# Patient Record
Sex: Male | Born: 1975 | Hispanic: Yes | Marital: Married | Smoking: Never smoker
Health system: Southern US, Community
[De-identification: ages and names within clinical notes are randomized; demographics above are authoritative.]

## PROBLEM LIST (undated history)

## (undated) DIAGNOSIS — K219 Gastro-esophageal reflux disease without esophagitis: Secondary | ICD-10-CM

## (undated) DIAGNOSIS — Z981 Arthrodesis status: Secondary | ICD-10-CM

## (undated) HISTORY — DX: Arthrodesis status: Z98.1

## (undated) HISTORY — PX: TONSILLECTOMY: SUR1361

## (undated) HISTORY — PX: SPINAL FUSION: SHX223

## (undated) HISTORY — PX: BACK SURGERY: SHX140

---

## 2020-05-28 ENCOUNTER — Emergency Department (HOSPITAL_COMMUNITY): Payer: Medicare Other

## 2020-05-28 ENCOUNTER — Other Ambulatory Visit: Payer: Self-pay

## 2020-05-28 ENCOUNTER — Encounter (HOSPITAL_COMMUNITY): Payer: Self-pay | Admitting: Emergency Medicine

## 2020-05-28 ENCOUNTER — Emergency Department (HOSPITAL_COMMUNITY)
Admission: EM | Admit: 2020-05-28 | Discharge: 2020-05-28 | Disposition: A | Discharge status: Home / Self Care | Payer: Medicare Other | Attending: Emergency Medicine | Admitting: Emergency Medicine

## 2020-05-28 DIAGNOSIS — S39012A Strain of muscle, fascia and tendon of lower back, initial encounter: Secondary | ICD-10-CM

## 2020-05-28 DIAGNOSIS — Y9389 Activity, other specified: Secondary | ICD-10-CM | POA: Insufficient documentation

## 2020-05-28 DIAGNOSIS — R519 Headache, unspecified: Secondary | ICD-10-CM | POA: Insufficient documentation

## 2020-05-28 DIAGNOSIS — Y92410 Unspecified street and highway as the place of occurrence of the external cause: Secondary | ICD-10-CM | POA: Insufficient documentation

## 2020-05-28 DIAGNOSIS — S161XXA Strain of muscle, fascia and tendon at neck level, initial encounter: Secondary | ICD-10-CM

## 2020-05-28 DIAGNOSIS — Y999 Unspecified external cause status: Secondary | ICD-10-CM | POA: Insufficient documentation

## 2020-05-28 DIAGNOSIS — S199XXA Unspecified injury of neck, initial encounter: Secondary | ICD-10-CM | POA: Diagnosis present
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
## 2020-05-28 MED ORDER — DIAZEPAM 5 MG PO TABS
5.0000 mg | ORAL_TABLET | Freq: Two times a day (BID) | ORAL | 0 refills | Status: DC
Start: 2020-05-28 — End: 2020-10-02

## 2020-05-28 MED ORDER — OXYCODONE-ACETAMINOPHEN 5-325 MG PO TABS: 1.0000 | ORAL_TABLET | ORAL | 0 refills | Status: DC | PRN

## 2020-05-28 MED ORDER — MORPHINE SULFATE (PF) 4 MG/ML IV SOLN
4.0000 mg | Freq: Once | INTRAVENOUS | Status: AC
  Administered 2020-05-28: 4 mg via INTRAVENOUS
  Filled 2020-05-28: qty 1

## 2020-05-28 MED ORDER — LORAZEPAM 2 MG/ML IJ SOLN
1.0000 mg | Freq: Once | INTRAMUSCULAR | Status: AC
  Administered 2020-05-28: 1 mg via INTRAVENOUS
  Filled 2020-05-28: qty 1

## 2020-05-28 MED ORDER — ONDANSETRON HCL 4 MG/2ML IJ SOLN
4.0000 mg | Freq: Once | INTRAMUSCULAR | Status: AC
  Administered 2020-05-28: 4 mg via INTRAVENOUS
  Filled 2020-05-28: qty 2
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
## 2020-05-28 NOTE — ED Notes (Signed)
Patient verbalizes understanding of discharge instructions. Opportunity for questioning and answers were provided. Armband removed by staff, pt discharged from ED ambulatory to home.  

## 2020-10-02 ENCOUNTER — Encounter: Payer: Self-pay | Admitting: Emergency Medicine

## 2020-10-02 ENCOUNTER — Emergency Department: Payer: No Typology Code available for payment source

## 2020-10-02 ENCOUNTER — Other Ambulatory Visit: Payer: Self-pay

## 2020-10-02 ENCOUNTER — Ambulatory Visit (INDEPENDENT_AMBULATORY_CARE_PROVIDER_SITE_OTHER)
Admission: EM | Admit: 2020-10-02 | Discharge: 2020-10-02 | Disposition: A | Discharge status: Home / Self Care | Payer: No Typology Code available for payment source | Source: Home / Self Care

## 2020-10-02 ENCOUNTER — Observation Stay
Admission: EM | Admit: 2020-10-02 | Discharge: 2020-10-03 | Disposition: A | Discharge status: Home / Self Care | Payer: No Typology Code available for payment source | Attending: Internal Medicine | Admitting: Internal Medicine

## 2020-10-02 DIAGNOSIS — R200 Anesthesia of skin: Secondary | ICD-10-CM | POA: Diagnosis not present

## 2020-10-02 DIAGNOSIS — R0789 Other chest pain: Principal | ICD-10-CM | POA: Diagnosis present

## 2020-10-02 DIAGNOSIS — I10 Essential (primary) hypertension: Secondary | ICD-10-CM | POA: Insufficient documentation

## 2020-10-02 DIAGNOSIS — I493 Ventricular premature depolarization: Secondary | ICD-10-CM

## 2020-10-02 DIAGNOSIS — R030 Elevated blood-pressure reading, without diagnosis of hypertension: Secondary | ICD-10-CM | POA: Diagnosis not present

## 2020-10-02 DIAGNOSIS — R079 Chest pain, unspecified: Secondary | ICD-10-CM

## 2020-10-02 DIAGNOSIS — Z20822 Contact with and (suspected) exposure to covid-19: Secondary | ICD-10-CM | POA: Insufficient documentation

## 2020-10-02 DIAGNOSIS — R002 Palpitations: Secondary | ICD-10-CM | POA: Insufficient documentation

## 2020-10-02 HISTORY — DX: Gastro-esophageal reflux disease without esophagitis: K21.9

## 2020-10-02 LAB — URINALYSIS, ROUTINE W REFLEX MICROSCOPIC
Bilirubin Urine: NEGATIVE
Glucose, UA: NEGATIVE mg/dL
Hgb urine dipstick: NEGATIVE
Ketones, ur: NEGATIVE mg/dL
Leukocytes,Ua: NEGATIVE
Nitrite: NEGATIVE
Protein, ur: NEGATIVE mg/dL
Specific Gravity, Urine: 1.023 (ref 1.005–1.030)
pH: 6 (ref 5.0–8.0)

## 2020-10-02 LAB — BASIC METABOLIC PANEL
Anion gap: 7 (ref 5–15)
BUN: 12 mg/dL (ref 6–20)
CO2: 28 mmol/L (ref 22–32)
Calcium: 9 mg/dL (ref 8.9–10.3)
Chloride: 103 mmol/L (ref 98–111)
Creatinine, Ser: 1.08 mg/dL (ref 0.61–1.24)
GFR, Estimated: >60 mL/min (ref >60)
Glucose, Bld: 101 mg/dL — ABNORMAL HIGH (ref 70–99)
Potassium: 4.3 mmol/L (ref 3.5–5.1)
Sodium: 138 mmol/L (ref 135–145)

## 2020-10-02 LAB — HEPATIC FUNCTION PANEL
ALT: 25 U/L (ref 0–44)
AST: 27 U/L (ref 15–41)
Albumin: 3.8 g/dL (ref 3.5–5.0)
Alkaline Phosphatase: 58 U/L (ref 38–126)
Bilirubin, Direct: <0.1 mg/dL (ref 0.0–0.2)
Total Bilirubin: 0.6 mg/dL (ref 0.3–1.2)
Total Protein: 7.7 g/dL (ref 6.5–8.1)

## 2020-10-02 LAB — CBC
HCT: 43.5 % (ref 39.0–52.0)
Hemoglobin: 14.7 g/dL (ref 13.0–17.0)
MCH: 29.1 pg (ref 26.0–34.0)
MCHC: 33.8 g/dL (ref 30.0–36.0)
MCV: 86 fL (ref 80.0–100.0)
Platelets: 250 10*3/uL (ref 150–400)
RBC: 5.06 MIL/uL (ref 4.22–5.81)
RDW: 12.9 % (ref 11.5–15.5)
WBC: 8.8 10*3/uL (ref 4.0–10.5)
nRBC: 0 % (ref 0.0–0.2)

## 2020-10-02 LAB — URINE DRUG SCREEN, QUALITATIVE (ARMC ONLY)
Amphetamines, Ur Screen: NOT DETECTED
Barbiturates, Ur Screen: NOT DETECTED
Benzodiazepine, Ur Scrn: NOT DETECTED
Cannabinoid 50 Ng, Ur ~~LOC~~: NOT DETECTED
Cocaine Metabolite,Ur ~~LOC~~: NOT DETECTED
MDMA (Ecstasy)Ur Screen: NOT DETECTED
Methadone Scn, Ur: NOT DETECTED
Opiate, Ur Screen: NOT DETECTED
Phencyclidine (PCP) Ur S: NOT DETECTED
Tricyclic, Ur Screen: NOT DETECTED

## 2020-10-02 LAB — RESPIRATORY PANEL BY RT PCR (FLU A&B, COVID)
Influenza A by PCR: NEGATIVE
Influenza B by PCR: NEGATIVE
SARS Coronavirus 2 by RT PCR: NEGATIVE

## 2020-10-02 LAB — MAGNESIUM: Magnesium: 2 mg/dL (ref 1.7–2.4)

## 2020-10-02 LAB — TSH: TSH: 1.265 u[IU]/mL (ref 0.350–4.500)

## 2020-10-02 LAB — PHOSPHORUS: Phosphorus: 3 mg/dL (ref 2.5–4.6)

## 2020-10-02 LAB — FIBRIN DERIVATIVES D-DIMER (ARMC ONLY): Fibrin derivatives D-dimer (ARMC): 1048.57 ng/mL (FEU) — ABNORMAL HIGH (ref 0.00–499.00)

## 2020-10-02 LAB — TROPONIN I (HIGH SENSITIVITY)
Troponin I (High Sensitivity): 4 ng/L (ref <18)
Troponin I (High Sensitivity): 5 ng/L (ref <18)

## 2020-10-02 MED ORDER — HYDROCODONE-ACETAMINOPHEN 5-325 MG PO TABS: 1.0000 | ORAL_TABLET | ORAL | Status: DC | PRN

## 2020-10-02 MED ORDER — ACETAMINOPHEN 650 MG RE SUPP: 650.0000 mg | Freq: Four times a day (QID) | RECTAL | Status: DC | PRN

## 2020-10-02 MED ORDER — ASPIRIN 81 MG PO CHEW
CHEWABLE_TABLET | ORAL | Status: AC
  Administered 2020-10-02: 324 mg via ORAL
  Filled 2020-10-02: qty 4

## 2020-10-02 MED ORDER — ONDANSETRON HCL 4 MG PO TABS: 4.0000 mg | ORAL_TABLET | Freq: Four times a day (QID) | ORAL | Status: DC | PRN

## 2020-10-02 MED ORDER — SODIUM CHLORIDE 0.9% FLUSH
3.0000 mL | Freq: Two times a day (BID) | INTRAVENOUS | Status: DC
  Administered 2020-10-02: 3 mL via INTRAVENOUS

## 2020-10-02 MED ORDER — PANTOPRAZOLE SODIUM 40 MG PO TBEC
40.0000 mg | DELAYED_RELEASE_TABLET | Freq: Every day | ORAL | Status: DC
  Administered 2020-10-03: 40 mg via ORAL
  Filled 2020-10-02: qty 1

## 2020-10-02 MED ORDER — SODIUM CHLORIDE 0.9% FLUSH: 3.0000 mL | INTRAVENOUS | Status: DC | PRN

## 2020-10-02 MED ORDER — SODIUM CHLORIDE 0.9 % IV SOLN: 250.0000 mL | INTRAVENOUS | Status: DC | PRN

## 2020-10-02 MED ORDER — ACETAMINOPHEN 325 MG PO TABS: 650.0000 mg | ORAL_TABLET | Freq: Four times a day (QID) | ORAL | Status: DC | PRN

## 2020-10-02 MED ORDER — OXYCODONE HCL 5 MG PO TABS: 5.0000 mg | ORAL_TABLET | Freq: Every day | ORAL | Status: DC | PRN

## 2020-10-02 MED ORDER — ASPIRIN 81 MG PO CHEW
324.0000 mg | CHEWABLE_TABLET | Freq: Once | ORAL | Status: AC
  Administered 2020-10-02: 324 mg via ORAL

## 2020-10-02 MED ORDER — METOPROLOL TARTRATE 25 MG PO TABS
12.5000 mg | ORAL_TABLET | Freq: Two times a day (BID) | ORAL | Status: DC
  Administered 2020-10-02 – 2020-10-03 (×2): 12.5 mg via ORAL
  Filled 2020-10-02 (×2): qty 1

## 2020-10-02 MED ORDER — DULOXETINE HCL 30 MG PO CPEP
60.0000 mg | ORAL_CAPSULE | Freq: Every day | ORAL | Status: DC
  Administered 2020-10-03: 60 mg via ORAL
  Filled 2020-10-02: qty 2

## 2020-10-02 MED ORDER — ENOXAPARIN SODIUM 80 MG/0.8ML ~~LOC~~ SOLN
70.0000 mg | Freq: Every day | SUBCUTANEOUS | Status: DC
  Filled 2020-10-02: qty 0.8

## 2020-10-02 MED ORDER — ONDANSETRON HCL 4 MG/2ML IJ SOLN: 4.0000 mg | Freq: Four times a day (QID) | INTRAMUSCULAR | Status: DC | PRN

## 2020-10-02 MED ORDER — TIZANIDINE HCL 4 MG PO TABS
4.0000 mg | ORAL_TABLET | Freq: Four times a day (QID) | ORAL | Status: DC | PRN
  Administered 2020-10-02: 4 mg via ORAL
  Filled 2020-10-02 (×3): qty 1

## 2020-10-02 MED ORDER — ASPIRIN EC 81 MG PO TBEC
81.0000 mg | DELAYED_RELEASE_TABLET | Freq: Every day | ORAL | Status: DC
  Administered 2020-10-03: 81 mg via ORAL
  Filled 2020-10-02: qty 1

## 2020-10-02 NOTE — ED Triage Notes (Signed)
Patient c/o palpitations off and on for the past 5 days.  Patient c/o left sided neck pain and left arm numbness that started 4-4 days ago. Patient denies chest pain.

## 2020-10-02 NOTE — ED Triage Notes (Signed)
Pt reports intermittent sharp chest pain for the last 4 days. Pt reports SOB with the pain and radiation to his left arm and neck

## 2020-10-02 NOTE — H&P (Signed)
Brad Lowe WUJ:[id number] DOB: [birth year] DOA: 10/02/2020     PCP: Dellia Cloud, MD  [facility]:   NONE    Patient arrived to ER on 10/02/20 at 1340 Referred by Attending Sharyn Creamer, MD   Patient coming from: home Lives  With family    Chief Complaint:  Chief Complaint  Patient presents with  . Chest Pain  . Nausea    HPI: Brad Lowe is a 44 y.o. male with medical history significant of obesity, back pain, GERD, sp spinal fusion  Presented with   5 days of palpitations on and off, left neck pain and left arm numbness for the past 4 days, no CP. Noted elevated BP up to 161/99 Had prior cardiac cath 2018 that was normal. In the past he had similar symptoms and was told it was due to Neurontin he stopped taking it and symptoms have improved.   No Known HTN,DM or HL No new meds' CAD in family mother in late fourties Mother has Crest syndrome  Reports EtOh  Occasional heavy few time a year 1-2 beers every other week Denies excessive Caffeine intake  Infectious risk factors:  Reports shortness of breath   Has NOt been vaccinated against COVID Feb/March  Initial COVID TEST   in house  PCR testing  Pending  No results found for: SARSCOV2NAA   Regarding pertinent Chronic problems:   Morbid obesity-   BMI Readings from Last 1 Encounters:  10/02/20 42.72 kg/m    While in ER:  Patient is clinically symptomatic with every PVC Describes sensation as brief chest pressure when PVC occures  Troponin wnL ECG with bigemini and PVC's recurent chest pressure in ER  Hospitalist was called for admission for Chest pressure and abnormal ECG  The following Work up has been ordered so far:  Orders Placed This Encounter  Procedures  . DG Chest 2 View  . CBC  . Basic metabolic panel  . TSH  . Document Height and Actual Weight  . Consult to hospitalist  ALL PATIENTS BEING ADMITTED/HAVING PROCEDURES NEED COVID-19 SCREENING  . ED EKG      Following Medications were ordered in ER: Medications  aspirin chewable tablet 324 mg (324 mg Oral Given 10/02/20 1628)        Consult Orders  (From admission, onward)         Start     Ordered   10/02/20 1802  Consult to hospitalist  ALL PATIENTS BEING ADMITTED/HAVING PROCEDURES NEED COVID-19 SCREENING  Once       Comments: ALL PATIENTS BEING ADMITTED/HAVING PROCEDURES NEED COVID-19 SCREENING  Provider:  (Not yet assigned)  Question Answer Comment  Place call to: hospitalist   Reason for Consult Admit   Diagnosis/Clinical Info for Consult: chest pain and bigeminy      10/02/20 1802          Significant initial  Findings: Abnormal Labs Reviewed  BASIC METABOLIC PANEL - Abnormal; Notable for the following components:      Result Value   Glucose, Bld 101 (*)    All other components within normal limits    Otherwise labs showing:   Recent Labs  Lab 10/02/20 1415  NA 138  K 4.3  CO2 28  GLUCOSE 101*  BUN 12  CREATININE 1.08  CALCIUM 9.0    Cr  stable,   Lab Results  Component Value Date   CREATININE 1.08 10/02/2020    No results for input(s): AST, ALT, ALKPHOS,  BILITOT, PROT, ALBUMIN in the last 168 hours. Lab Results  Component Value Date   CALCIUM 9.0 10/02/2020   WBC      Component Value Date/Time   WBC 8.8 10/02/2020 1351    Plt: Lab Results  Component Value Date   PLT 250 10/02/2020    HG/HCT  stable,       Component Value Date/Time   HGB 14.7 10/02/2020 1351   HCT 43.5 10/02/2020 1351   MCV 86.0 10/02/2020 1351    Troponin 4-5     ECG: Ordered Personally reviewed by me showing: HR : 78 Rhythm:  NSR,  PVCs (bigeminy)   nonspecific changes  QTC 462  Repeat ECG showing NSR    UA   Ordered     Ordered CXR -  NON acute   ED Triage Vitals  Enc Vitals Group     BP 10/02/20 1348 (!) 146/78     Pulse Rate 10/02/20 1348 79     Resp 10/02/20 1348 16     Temp 10/02/20 1348 98.6 F (37 C)     Temp Source 10/02/20 1348 Oral      SpO2 10/02/20 1348 99 %     Weight 10/02/20 1343 (!) 315 lb (142.9 kg)     Height 10/02/20 1343 6' (1.829 m)     Head Circumference --      Peak Flow --      Pain Score --      Pain Loc --      Pain Edu? --      Excl. in GC? --   TMAX(24)@       Latest  Blood pressure 133/84, pulse 71, temperature 98.6 F (37 C), temperature source Oral, resp. rate (!) 22, height 6' (1.829 m), weight (!) 142.9 kg, SpO2 97 %.    Review of Systems:    Pertinent positives include: chest pressure  Constitutional:  No weight loss, night sweats, Fevers, chills, fatigue, weight loss  HEENT:  No headaches, Difficulty swallowing,Tooth/dental problems,Sore throat,  No sneezing, itching, ear ache, nasal congestion, post nasal drip,  Cardio-vascular:  No chest pain, Orthopnea, PND, anasarca, dizziness, palpitations.no Bilateral lower extremity swelling  GI:  No heartburn, indigestion, abdominal pain, nausea, vomiting, diarrhea, change in bowel habits, loss of appetite, melena, blood in stool, hematemesis Resp:  no shortness of breath at rest. No dyspnea on exertion, No excess mucus, no productive cough, No non-productive cough, No coughing up of blood.No change in color of mucus.No wheezing. Skin:  no rash or lesions. No jaundice GU:  no dysuria, change in color of urine, no urgency or frequency. No straining to urinate.  No flank pain.  Musculoskeletal:  No joint pain or no joint swelling. No decreased range of motion. No back pain.  Psych:  No change in mood or affect. No depression or anxiety. No memory loss.  Neuro: no localizing neurological complaints, no tingling, no weakness, no double vision, no gait abnormality, no slurred speech, no confusion  All systems reviewed and apart from HOPI all are negative  Past Medical History:   Past Medical History:  Diagnosis Date  . GERD (gastroesophageal reflux disease)   . History of spinal fusion      Past Surgical History:  Procedure  Laterality Date  . BACK SURGERY    . SPINAL FUSION    . TONSILLECTOMY      Social History:  Ambulatory   Independently     reports that he has never smoked. He  has never used smokeless tobacco. He reports current alcohol use. He reports that he does not use drugs.     Family History:   Family History  Problem Relation Age of Onset  . Hypertension Mother   . Stroke Mother   . Healthy Father     Allergies: No Known Allergies   Prior to Admission medications   Medication Sig Start Date End Date Taking? Authorizing Provider  acetaminophen (TYLENOL) 325 MG tablet Take 325-650 mg by mouth every 6 (six) hours as needed for mild pain or fever.   Yes [provider]  DULoxetine (CYMBALTA) 60 MG capsule Take 60 mg by mouth daily.   Yes [provider]  meloxicam (MOBIC) 15 MG tablet Take 7.5-15 mg by mouth daily.    Yes [provider]  omeprazole (PRILOSEC) 20 MG capsule Take 20 mg by mouth daily.   Yes [provider]  oxyCODONE (OXY IR/ROXICODONE) 5 MG immediate release tablet Take 5 mg by mouth daily as needed for moderate pain or severe pain.   Yes [provider]  tiZANidine (ZANAFLEX) 4 MG tablet Take 4 mg by mouth every 6 (six) hours as needed for muscle spasms.   Yes [provider]  Takes Cialis every day  Physical Exam: Vitals with BMI 10/02/2020 10/02/2020 10/02/2020  Height - - -  Weight - - -  BMI - - -  Systolic 133 150 [id number]  Diastolic 84 85 89  Pulse 71 72 80     1. General:  in No Acute distress   Chronically ill -appearing 2. Psychological: Alert and   Oriented 3. Head/ENT:     Dry Mucous Membranes                          Head Non traumatic, neck supple                          Normal   Dentition 4. SKIN: decreased Skin turgor,  Skin clean Dry and intact no rash 5. Heart: Regular rate and rhythm no  Murmur, no Rub or gallop 6. Lungs:   Clear to auscultation bilaterally, no wheezes or crackles   7.  Abdomen: Soft,  on-tender, Non distended   Obese bowel sounds present 8. Lower extremities: no clubbing, cyanosis, no  edema 9. Neurologically Grossly intact, moving all 4 extremities equally  10. MSK: Normal range of motion   All other LABS:     Recent Labs  Lab 10/02/20 1351  WBC 8.8  HGB 14.7  HCT 43.5  MCV 86.0  PLT 250     Recent Labs  Lab 10/02/20 1415  NA 138  K 4.3  CL 103  CO2 28  GLUCOSE 101*  BUN 12  CREATININE 1.08  CALCIUM 9.0     No results for input(s): AST, ALT, ALKPHOS, BILITOT, PROT, ALBUMIN in the last 168 hours.     Cultures: No results found for: SDES, SPECREQUEST, CULT, REPTSTATUS   Radiological Exams on Admission: DG Chest 2 View  Result Date: 10/02/2020 CLINICAL DATA:  Chest pain and shortness of breath EXAM: CHEST - 2 VIEW COMPARISON:  None. FINDINGS: The heart size and mediastinal contours are within normal limits. Both lungs are clear. The visualized skeletal structures are unremarkable. IMPRESSION: No active cardiopulmonary disease. Electronically Signed   By: Gaylyn RongWalter  Liebkemann M.D.   On: 10/02/2020 14:28    Chart has been reviewed  Assessment/Plan  44 y.o. male with medical history significant of obesity, back pain, GERD, sp spina fusion Admitted for chest pressure and abnormal ECG with frequent PVC  Present on Admission: . Left chest pressure - - H=  1 ,E= 1  ,A=0 , R   1 , T 0  ,  for the  Total of 3 therefore will admit for observation and further evaluation ( Risk of MACE: Scores 0-3  of 0.9-1.7%.,  4-6: 12-16.6% , Scores ?7: 50-65% ) - if d.dimer is sig elevated would get CTA - troponin unremarkable  -non-specific changes on ECG Frequent PVC's that are symptomatic and likely what is causing Pt discomfort  - Electrolytes WNL Discuss with cardiology - may need BB   - monitor on telemetry, cycle cardiac enzymes, obtain serial ECG and  ECHO in AM.   - Daily aspirin -  Further risk stratify with lipid panel, hgA1C,  TSH  wnl  Make sure patient is on Aspirin.  We will notify cardiology regarding patient's admission. Further management depends on pending  workup  . Obesity, Class III, BMI 40-49.9 (morbid obesity) (HCC) -would benefit from nutritional consult as an outpatient  . Elevated BP without diagnosis of hypertension -monitor blood pressure if persistent will need reassessment as an outpatient and possibly initiation of blood pressure medication May need to start betablocker prior to dc  Other plan as per orders.  DVT prophylaxis:   Lovenox      Code Status:    Code Status: Not on file FULL CODE   as per patient  I had personally discussed CODE STATUS with patient    Family Communication:   Family  at  Bedside  plan of care was discussed  with SO Disposition Plan:   To home once workup is complete and patient is stable   Following barriers for discharge:                                                        Chest pressure controlled with PO medications                                                        Will need consultants to evaluate patient prior to discharge                       Consults called:  Cardiology notified Dr. Juliann Pares  Admission status:  ED Disposition    ED Disposition Condition Comment   Admit  Hospital [facility] [[id number]]  Level of Care: Med-Surg [16]  Covid Evaluation: Asymptomatic Screening Protocol (No Symptoms)  Diagnosis: Left chest pressure [161096]  Admitting Physician: Therisa Doyne [[id number]]  Attending Physician: Therisa Doyne [[id number]]      Obs     Level of care     tele  For   24H     No results found for: SARSCOV2NAA   Precautions: admitted as  asymptomatic screening protocol  PPE: Used by the provider:    P100  eye Goggles,  Gloves   Riannah Stagner 10/02/2020, 8:09 PM    [facility]  after 2 AM please page floor coverage PA If 7AM-7PM, please contact the day team taking care of the  patient using [email or web address]   Patient was evaluated in the context of the global COVID-19 pandemic, which necessitated consideration that the patient might be at risk for infection with the SARS-CoV-2 virus that causes COVID-19. Institutional protocols and algorithms that pertain to the evaluation of patients at risk for COVID-19 are in a state of rapid change based on information released by regulatory bodies including the CDC and federal and state organizations. These policies and algorithms were followed during the patient's care.

## 2020-10-02 NOTE — ED Notes (Signed)
Assisting primary RN, pt provided with [place] sandwich, chips, crackers, and ice water per provider approval. AO x4. Up ad lib in room. Visitor at bedside

## 2020-10-02 NOTE — ED Provider Notes (Signed)
[facility]    CSN: [id number] Arrival date & time: 10/02/20  1213      History   Chief Complaint Chief Complaint  Patient presents with   Palpitations   Numbness    left arm    HPI Brad Lowe is a 44 y.o. male presents for palpitations off and on for the past 4 to 5 days.  He states that he has had some numbness into the left part of his neck as well as down his left arm to his hand that has been intermittent.  Symptoms do seem to be getting worse.  He denies any associated chest pain, but says that he feels like he is having difficulty breathing when the palpitations hit him.  Denies any history of abnormal heart rhythm.  He states that he did experience similar symptoms a few years ago and had a cardiac cath which was normal.  He says his only medical problem is back pain with previous back surgery.  He denies any hypertension, hyperlipidemia or diabetes.  Patient denies any problems with neck pain or cervical disc disorders causing any paresthesias.  He says he was told years ago that his numbness in the arm could be due to gabapentin that he is to be taking.  He says that he stopped taking this medication and the symptoms resolved at that time.  He denies taking that medication again and has not started to take any new medications recently.  He has no other concerns today.  HPI  Past Medical History:  Diagnosis Date   GERD (gastroesophageal reflux disease)    History of spinal fusion     There are no problems to display for this patient.   Past Surgical History:  Procedure Laterality Date   BACK SURGERY     SPINAL FUSION     TONSILLECTOMY         Home Medications    Prior to Admission medications   Medication Sig Start Date End Date Taking? Authorizing Provider  meloxicam (MOBIC) 7.5 MG tablet Take 7.5 mg by mouth daily.   Yes [provider]  omeprazole (PRILOSEC) 20 MG capsule Take 20 mg by mouth daily.   Yes [provider]  tiZANidine (ZANAFLEX) 4 MG tablet Take 4 mg by mouth every 6 (six) hours as needed for muscle spasms.   Yes [provider]  diazepam (VALIUM) 5 MG tablet Take 1 tablet (5 mg total) by mouth 2 (two) times daily. 05/28/20   Jacalyn Lefevre, MD  oxyCODONE-acetaminophen (PERCOCET/ROXICET) 5-325 MG tablet Take 1 tablet by mouth every 4 (four) hours as needed for severe pain. 05/28/20   Jacalyn Lefevre, MD    Family History Family History  Problem Relation Age of Onset   Hypertension Mother    Stroke Mother    Healthy Father     Social History Social History   Tobacco Use   Smoking status: Never Smoker   Smokeless tobacco: Never Used  Building services engineer Use: Never used  Substance Use Topics   Alcohol use: Yes   Drug use: Never     Allergies   Patient has no known allergies.   Review of Systems Review of Systems  Constitutional: Negative for diaphoresis, fatigue and fever.  Respiratory: Positive for shortness of breath. Negative for cough, chest tightness and wheezing.   Cardiovascular: Positive for palpitations. Negative for chest pain and leg swelling.  Gastrointestinal: Negative for nausea and vomiting.  Musculoskeletal: Negative for neck pain  and neck stiffness.  Neurological: Positive for numbness. Negative for dizziness, syncope, weakness and headaches.     Physical Exam Triage Vital Signs ED Triage Vitals  Enc Vitals Group     BP 10/02/20 1231 (!) 161/99     Pulse Rate 10/02/20 1231 85     Resp 10/02/20 1231 16     Temp 10/02/20 1231 98.5 F (36.9 C)     Temp Source 10/02/20 1231 Oral     SpO2 10/02/20 1231 99 %     Weight 10/02/20 1226 (!) 315 lb (142.9 kg)     Height 10/02/20 1226 6' (1.829 m)     Head Circumference --      Peak Flow --      Pain Score 10/02/20 1226 2     Pain Loc --      Pain Edu? --      Excl. in GC? --    No data found.  Updated Vital Signs BP (!) 161/99 (BP Location: Left Arm)    Pulse 85    Temp  98.5 F (36.9 C) (Oral)    Resp 16    Ht 6' (1.829 m)    Wt (!) 315 lb (142.9 kg)    SpO2 99%    BMI 42.72 kg/m      Physical Exam Vitals and nursing note reviewed.  Constitutional:      General: He is not in acute distress.    Appearance: Normal appearance. He is well-developed. He is obese. He is not ill-appearing, toxic-appearing or diaphoretic.  HENT:     Head: Normocephalic and atraumatic.     Nose: Nose normal.     Mouth/Throat:     Mouth: Mucous membranes are moist.     Pharynx: Oropharynx is clear.  Eyes:     General: No scleral icterus.    Conjunctiva/sclera: Conjunctivae normal.  Cardiovascular:     Rate and Rhythm: Normal rate and regular rhythm.     Pulses: Normal pulses.     Heart sounds: Normal heart sounds.  Pulmonary:     Effort: Pulmonary effort is normal. No respiratory distress.     Breath sounds: Normal breath sounds. No wheezing, rhonchi or rales.  Musculoskeletal:     Cervical back: Neck supple. No tenderness.     Comments: No weakness  Skin:    General: Skin is warm and dry.  Neurological:     General: No focal deficit present.     Mental Status: He is alert. Mental status is at baseline.     Motor: No weakness.     Gait: Gait normal.  Psychiatric:        Mood and Affect: Mood normal.        Behavior: Behavior normal.        Thought Content: Thought content normal.      UC Treatments / Results  Labs (all labs ordered are listed, but only abnormal results are displayed) Labs Reviewed - No data to display  EKG   Radiology DG Chest 2 View  Result Date: 10/02/2020 CLINICAL DATA:  Chest pain and shortness of breath EXAM: CHEST - 2 VIEW COMPARISON:  None. FINDINGS: The heart size and mediastinal contours are within normal limits. Both lungs are clear. The visualized skeletal structures are unremarkable. IMPRESSION: No active cardiopulmonary disease. Electronically Signed   By: Gaylyn Rong M.D.   On: 10/02/2020 14:28    Procedures ED  EKG  Date/Time: 10/02/2020 12:50 PM Performed by: Shirlee Latch,  PA-C Authorized by: Shirlee Latch, PA-C   ECG reviewed by ED Physician in the absence of a cardiologist: yes   Previous ECG:    Previous ECG:  Unavailable Interpretation:    Interpretation: abnormal   Rate:    ECG rate:  78   ECG rate assessment: normal   Rhythm:    Rhythm: sinus rhythm   Ectopy:    Ectopy: none   QRS:    QRS axis:  Normal Conduction:    Conduction: normal   ST segments:    ST segments:  Normal T waves:    T waves: inverted     Inverted:  V2, V3 and V4 Comments:     Normal sinus rhythm with occasional PVCs and with T wave inversion in V2, V3 and V4   (including critical care time)  Medications Ordered in UC Medications - No data to display  Initial Impression / Assessment and Plan / UC Course  I have reviewed the triage vital signs and the nursing notes.  Pertinent labs & imaging results that were available during my care of the patient were reviewed by me and considered in my medical decision making (see chart for details).   44 year old male presenting with partner for palpitations, left arm numbness, and shortness of breath over the past few days.  He states that his symptoms are getting worse.  Vital signs significant for elevated blood pressure 161/99.  All other vital signs are normal and stable.  On exam he is in no acute distress.  Heart regular rate and rhythm and lungs clear to auscultation.  EKG obtained which shows normal sinus rhythm with occasional PVCs (bigeminy) and T wave inversion in V2, V3 and V4.  Based on patient's symptoms, EKG findings and hypertension advised that he needs immediate observation and further work-up in the emergency department.  Patient declined EMS and his partner will take him to [facility] at this time.  Patient leaving in stable condition.   Final Clinical Impressions(s) / UC Diagnoses   Final diagnoses:  Palpitations  PVC (premature ventricular  contraction)  Left arm numbness  Essential hypertension     Discharge Instructions     You have been advised to follow up immediately in the emergency department for concerning signs.symptoms. If you declined EMS transport, please have a family member take you directly to the ED at this time. Do not delay. Based on concerns about condition, if you do not follow up in th e ED, you may risk poor outcomes including worsening of condition, delayed treatment and potentially life threatening issues. If you have declined to go to the ED at this time, you should call your PCP immediately to set up a follow up appointment.  Go to ED for red flag symptoms, including; fevers you cannot reduce with Tylenol/Motrin, severe headaches, vision changes, numbness/weakness in part of the body, lethargy, confusion, intractable vomiting, severe dehydration, chest pain, breathing difficulty, severe persistent abdominal or pelvic pain, signs of severe infection (increased redness, swelling of an area), feeling faint or passing out, dizziness, etc. You should especially go to the ED for sudden acute worsening of condition if you do not elect to go at this time.     ED Prescriptions    None     PDMP not reviewed this encounter.   Shirlee Latch, PA-C 10/02/20 1510

## 2020-10-02 NOTE — ED Provider Notes (Signed)
[facility] Emergency Department Provider Note ____________________________________________   First MD Initiated Contact with Patient 10/02/20 1548     (approximate)  I have reviewed the triage vital signs and the nursing notes.  HISTORY  Chief Complaint Chest Pain and Nausea  HPI Brad Lowe is a 44 y.o. male   history of chronic back pain. Patient reports that for the last few days he has noticed episodes of skipping feeling in his chest. Also intermittent chest pressure, located over the left chest. Radiates occasionally with a tingling sensation towards his left neck and left arm. Went to urgent care today was referred to the ER for concerns of abnormal EKG  Patient does report he thinks about 4 years ago he had a cardiac catheterization at the [facility] in [place]. He was told it was "clean" at that point.  Patient denies shortness of breath. No recent illness. No fever chills no nausea or vomiting  Patient continues to have a very mild sense of pressure over the left side of chest and he continues to feel skipped beats occasionally.  Past Medical History:  Diagnosis Date  . GERD (gastroesophageal reflux disease)   . History of spinal fusion     There are no problems to display for this patient.   Past Surgical History:  Procedure Laterality Date  . BACK SURGERY    . SPINAL FUSION    . TONSILLECTOMY      Prior to Admission medications   Medication Sig Start Date End Date Taking? Authorizing Provider  acetaminophen (TYLENOL) 325 MG tablet Take 325-650 mg by mouth every 6 (six) hours as needed for mild pain or fever.   Yes [provider]  DULoxetine (CYMBALTA) 60 MG capsule Take 60 mg by mouth daily.   Yes [provider]  meloxicam (MOBIC) 15 MG tablet Take 7.5-15 mg by mouth daily.    Yes [provider]  omeprazole (PRILOSEC) 20 MG capsule Take 20 mg by mouth daily.   Yes [provider]  oxyCODONE  (OXY IR/ROXICODONE) 5 MG immediate release tablet Take 5 mg by mouth daily as needed for moderate pain or severe pain.   Yes [provider]  tiZANidine (ZANAFLEX) 4 MG tablet Take 4 mg by mouth every 6 (six) hours as needed for muscle spasms.   Yes [provider]  Cialis, last use possibly last night  Allergies Patient has no known allergies.  Family History  Problem Relation Age of Onset  . Hypertension Mother   . Stroke Mother   . Healthy Father     Social History Social History   Tobacco Use  . Smoking status: Never Smoker  . Smokeless tobacco: Never Used  Vaping Use  . Vaping Use: Never used  Substance Use Topics  . Alcohol use: Yes  . Drug use: Never    Review of Systems Constitutional: No fever/chills ENT: No sore throat. Cardiovascular: Denies chest pain. Respiratory: Denies shortness of breath. Gastrointestinal: No abdominal pain.   Musculoskeletal: Negative for back pain. Skin: Negative for rash. Neurological: Negative for headaches, areas of focal weakness or numbness. No recent travel history. No history of blood clots. No leg swelling.   ____________________________________________   PHYSICAL EXAM:  VITAL SIGNS: ED Triage Vitals  Enc Vitals Group     BP 10/02/20 1348 (!) 146/78     Pulse Rate 10/02/20 1348 79     Resp 10/02/20 1348 16     Temp 10/02/20 1348 98.6 F (37  C)     Temp Source 10/02/20 1348 Oral     SpO2 10/02/20 1348 99 %     Weight 10/02/20 1343 (!) 315 lb (142.9 kg)     Height 10/02/20 1343 6' (1.829 m)     Head Circumference --      Peak Flow --      Pain Score --      Pain Loc --      Pain Edu? --      Excl. in GC? --     Constitutional: Alert and oriented. Well appearing and in no acute distress. Eyes: Conjunctivae are normal. Head: Atraumatic. Nose: No congestion/rhinnorhea. Mouth/Throat: Mucous membranes are moist. Neck: No stridor.  Cardiovascular: Normal rate, regular rhythm. Grossly normal  heart sounds.  Good peripheral circulation. Respiratory: Normal respiratory effort.  No retractions. Lungs CTAB. Gastrointestinal: Soft and nontender. No distention. Musculoskeletal: No lower extremity tenderness nor edema. Neurologic:  Normal speech and language. No gross focal neurologic deficits are appreciated.  Skin:  Skin is warm, dry and intact. No rash noted. Psychiatric: Mood and affect are normal. Speech and behavior are normal.  ____________________________________________   LABS (all labs ordered are listed, but only abnormal results are displayed)  Labs Reviewed  BASIC METABOLIC PANEL - Abnormal; Notable for the following components:      Result Value   Glucose, Bld 101 (*)    All other components within normal limits  RESPIRATORY PANEL BY RT PCR (FLU A&B, COVID)  CBC  TSH  MAGNESIUM  PHOSPHORUS  FIBRIN DERIVATIVES D-DIMER ([facility] ONLY)  TROPONIN I (HIGH SENSITIVITY)  TROPONIN I (HIGH SENSITIVITY)   ____________________________________________  EKG  Reviewed interpreted at 1345 Heart rate 80 QRS 99 QTc 450 Normal sinus rhythm, T wave inversion in V2 and V1, concerning for possible ischemic etiology.  No STEMI Repeat EKG performed at 1810 Heart rate 70 QRS 110 QTc 440 Normal sinus rhythm, again seen inversions V1 and V2, no significant changes found.  Of note able to review outside records from [place] over the patient's medical record on his iPad, does note previous normal coronary catheterization in April 2018 and noted "T wave inversions" on his previous EKG per the [place] though I cannot specifically look at the EKG I suspect this may be old ____________________________________________  RADIOLOGY  DG Chest 2 View  Result Date: 10/02/2020 CLINICAL DATA:  Chest pain and shortness of breath EXAM: CHEST - 2 VIEW COMPARISON:  None. FINDINGS: The heart size and mediastinal contours are within normal limits. Both lungs are clear. The visualized skeletal structures are  unremarkable. IMPRESSION: No active cardiopulmonary disease. Electronically Signed   By: Gaylyn Rong M.D.   On: 10/02/2020 14:28    Chest x-ray normal ____________________________________________   PROCEDURES  Procedure(s) performed: None  .1-3 Lead EKG Interpretation Performed by: Sharyn Creamer, MD Authorized by: Sharyn Creamer, MD     Interpretation: normal     ECG rate:  80   ECG rate assessment: normal     Rhythm: sinus rhythm     Rhythm comment:  Occasional episodes of bigeminy   Ectopy: bigeminy     Conduction: abnormal      Critical Care performed: No  ____________________________________________   INITIAL IMPRESSION / ASSESSMENT AND PLAN / ED COURSE  Pertinent labs & imaging results that were available during my care of the patient were reviewed by me and considered in my medical decision making (see chart for details).   Differential diagnosis includes, but is not limited  to, ACS, aortic dissection, pulmonary embolism, cardiac tamponade, pneumothorax, pneumonia, pericarditis, myocarditis, GI-related causes including esophagitis/gastritis, and musculoskeletal chest wall pain.  Concern is the patient has T wave inversions in anterior precordial leads associated with chest pressure.  No ripping tearing or moving pain.  PERC negative.  No signs or symptoms of dissection no back pain except for chronic.  First troponin reassuring and normal second as well.  However after being observed in the ER the patient does report recurrence of chest pressure and is noted to have frequent episodes of bigeminy. Discussed with patient and his wife, offered to transfer the [facility], patient declines this based on preference for care at this hospital as well as reports having secondary insurance.  [facility] given [facility] paperwork to [facility] declining transfer   Clinical Course as of Oct 03 1851  Wynelle LinkSun Oct 02, 2020  1624 Contacted [facility], they advised unable to release records after 2 PM today  as medical records is closed   [MQ]    Clinical Course User Index [MQ] Sharyn CreamerQuale, Gagandeep Pettet, MD       Pulmonary Embolism Rule-out Criteria (PERC rule)                        If YES to ANY of the following, the [facility] rule is not satisfied and cannot be used to rule out PE in this patient (consider d-dimer or imaging depending on pre-test probability).                      If NO to ALL of the following, AND the clinician's pre-test probability is <15%, the [facility] rule is satisfied and there is no need for further workup (including no need to obtain a d-dimer) as the post-test probability of pulmonary embolism is <2%.                      Mnemonic is HAD CLOTS   H - hormone use (exogenous estrogen)      No. A - age > 50                                                 No. D - DVT/PE history                                      No.   C - coughing blood (hemoptysis)                 No. L - leg swelling, unilateral                             No. O - O2 Sat on Room Air < 95%                  No. T - tachycardia (HR ? 100)                         No. S - surgery or trauma, recent                      No.   Based on my evaluation of the  patient, including application of this decision instrument, further testing to evaluate for pulmonary embolism is not indicated at this time.  ----------------------------------------- 6:39 PM on 10/02/2020 -----------------------------------------  We will admit patient due to T wave inversion associated with recurrent chest pressure while in chest pain observation in the ED.  Patient and wife agreeable with plan.   ----------------------------------------- 6:51 PM on 10/02/2020 -----------------------------------------  Patient does not wish for any additional pain medicine does however it can do experience pressure in the left upper chest is repeat EKG reassuring.  Due to his ongoing chest pressure will admit.  Discussed with hospitalist.  Patient and wife  agreeable with plan for admission and further work-up. ____________________________________________   FINAL CLINICAL IMPRESSION(S) / ED DIAGNOSES  Final diagnoses:  Chest pain with low risk for cardiac etiology        Note:  This document was prepared using Dragon voice recognition software and may include unintentional dictation errors       Sharyn Creamer, MD 10/02/20 [phone]

## 2020-10-02 NOTE — ED Notes (Signed)
Repeat EKG done.

## 2020-10-02 NOTE — ED Notes (Signed)
Patient is being discharged from the Urgent Care and sent to the [facility] Emergency Department via private vehicle . Per Eusebio Friendly, PA, patient is in need of higher level of care due to abnormal EKD. Patient is aware and verbalizes understanding of plan of care.  Vitals:   10/02/20 1231  BP: (!) 161/99  Pulse: 85  Resp: 16  Temp: 98.5 F (36.9 C)  SpO2: 99%

## 2020-10-02 NOTE — Discharge Instructions (Addendum)

## 2020-10-03 ENCOUNTER — Observation Stay
Admit: 2020-10-03 | Discharge: 2020-10-03 | Disposition: A | Discharge status: Home / Self Care | Payer: No Typology Code available for payment source | Attending: Internal Medicine | Admitting: Internal Medicine

## 2020-10-03 ENCOUNTER — Encounter: Payer: Self-pay | Admitting: Internal Medicine

## 2020-10-03 ENCOUNTER — Observation Stay: Payer: No Typology Code available for payment source

## 2020-10-03 DIAGNOSIS — E785 Hyperlipidemia, unspecified: Secondary | ICD-10-CM | POA: Diagnosis not present

## 2020-10-03 DIAGNOSIS — R030 Elevated blood-pressure reading, without diagnosis of hypertension: Secondary | ICD-10-CM

## 2020-10-03 DIAGNOSIS — I493 Ventricular premature depolarization: Secondary | ICD-10-CM

## 2020-10-03 LAB — CBC WITH DIFFERENTIAL/PLATELET
Abs Immature Granulocytes: 0.01 10*3/uL (ref 0.00–0.07)
Basophils Absolute: 0.1 10*3/uL (ref 0.0–0.1)
Basophils Relative: 1 %
Eosinophils Absolute: 0.2 10*3/uL (ref 0.0–0.5)
Eosinophils Relative: 4 %
HCT: 44.9 % (ref 39.0–52.0)
Hemoglobin: 15 g/dL (ref 13.0–17.0)
Immature Granulocytes: 0 %
Lymphocytes Relative: 34 %
Lymphs Abs: 1.8 10*3/uL (ref 0.7–4.0)
MCH: 28.9 pg (ref 26.0–34.0)
MCHC: 33.4 g/dL (ref 30.0–36.0)
MCV: 86.5 fL (ref 80.0–100.0)
Monocytes Absolute: 0.5 10*3/uL (ref 0.1–1.0)
Monocytes Relative: 9 %
Neutro Abs: 2.8 10*3/uL (ref 1.7–7.7)
Neutrophils Relative %: 52 %
Platelets: 239 10*3/uL (ref 150–400)
RBC: 5.19 MIL/uL (ref 4.22–5.81)
RDW: 12.9 % (ref 11.5–15.5)
WBC: 5.4 10*3/uL (ref 4.0–10.5)
nRBC: 0 % (ref 0.0–0.2)

## 2020-10-03 LAB — PHOSPHORUS: Phosphorus: 3.2 mg/dL (ref 2.5–4.6)

## 2020-10-03 LAB — COMPREHENSIVE METABOLIC PANEL
ALT: 21 U/L (ref 0–44)
AST: 21 U/L (ref 15–41)
Albumin: 3.7 g/dL (ref 3.5–5.0)
Alkaline Phosphatase: 56 U/L (ref 38–126)
Anion gap: 8 (ref 5–15)
BUN: 13 mg/dL (ref 6–20)
CO2: 25 mmol/L (ref 22–32)
Calcium: 8.7 mg/dL — ABNORMAL LOW (ref 8.9–10.3)
Chloride: 104 mmol/L (ref 98–111)
Creatinine, Ser: 0.9 mg/dL (ref 0.61–1.24)
GFR, Estimated: >60 mL/min (ref >60)
Glucose, Bld: 95 mg/dL (ref 70–99)
Potassium: 3.8 mmol/L (ref 3.5–5.1)
Sodium: 137 mmol/L (ref 135–145)
Total Bilirubin: 0.6 mg/dL (ref 0.3–1.2)
Total Protein: 7.4 g/dL (ref 6.5–8.1)

## 2020-10-03 LAB — NM MYOCAR MULTI W/SPECT W/WALL MOTION / EF
Estimated workload: 1 METS
Exercise duration (min): 1 min
Exercise duration (sec): 0 s
LV dias vol: 161 mL (ref 62–150)
LV sys vol: 73 mL
Peak HR: 86 {beats}/min
Rest HR: 60 {beats}/min
SDS: 0
SRS: 1
SSS: 0
TID: 0.96

## 2020-10-03 LAB — LIPID PANEL
Cholesterol: 193 mg/dL (ref 0–200)
HDL: 37 mg/dL — ABNORMAL LOW (ref >40)
LDL Cholesterol: 126 mg/dL — ABNORMAL HIGH (ref 0–99)
Total CHOL/HDL Ratio: 5.2 RATIO
Triglycerides: 152 mg/dL — ABNORMAL HIGH (ref <150)
VLDL: 30 mg/dL (ref 0–40)

## 2020-10-03 LAB — HEMOGLOBIN A1C
Hgb A1c MFr Bld: 5.3 % (ref 4.8–5.6)
Mean Plasma Glucose: 105.41 mg/dL

## 2020-10-03 LAB — MAGNESIUM: Magnesium: 2.1 mg/dL (ref 1.7–2.4)

## 2020-10-03 LAB — HIV ANTIBODY (ROUTINE TESTING W REFLEX): HIV Screen 4th Generation wRfx: NONREACTIVE

## 2020-10-03 MED ORDER — METOPROLOL TARTRATE 25 MG PO TABS: 25.0000 mg | ORAL_TABLET | Freq: Two times a day (BID) | ORAL | 11 refills | Status: DC

## 2020-10-03 MED ORDER — ATORVASTATIN CALCIUM 40 MG PO TABS: 40.0000 mg | ORAL_TABLET | Freq: Every day | ORAL | 0 refills | Status: AC

## 2020-10-03 MED ORDER — REGADENOSON 0.4 MG/5ML IV SOLN
0.4000 mg | Freq: Once | INTRAVENOUS | Status: AC
  Administered 2020-10-03: 0.4 mg via INTRAVENOUS

## 2020-10-03 MED ORDER — IOHEXOL 350 MG/ML SOLN
75.0000 mL | Freq: Once | INTRAVENOUS | Status: AC | PRN
  Administered 2020-10-03: 75 mL via INTRAVENOUS

## 2020-10-03 MED ORDER — METOPROLOL TARTRATE 25 MG PO TABS: 25.0000 mg | ORAL_TABLET | Freq: Two times a day (BID) | ORAL | 0 refills | Status: AC

## 2020-10-03 MED ORDER — TECHNETIUM TC 99M TETROFOSMIN IV KIT
30.0000 | PACK | Freq: Once | INTRAVENOUS | Status: AC | PRN
  Administered 2020-10-03: 32.487 via INTRAVENOUS

## 2020-10-03 MED ORDER — TECHNETIUM TC 99M TETROFOSMIN IV KIT
10.0000 | PACK | Freq: Once | INTRAVENOUS | Status: AC | PRN
  Administered 2020-10-03: 10.767 via INTRAVENOUS

## 2020-10-03 MED ORDER — METOPROLOL TARTRATE 25 MG PO TABS: 25.0000 mg | ORAL_TABLET | Freq: Two times a day (BID) | ORAL | Status: DC

## 2020-10-03 MED ORDER — METOPROLOL TARTRATE 25 MG PO TABS: 12.5000 mg | ORAL_TABLET | Freq: Two times a day (BID) | ORAL | 0 refills | Status: DC

## 2020-10-03 MED ORDER — ATORVASTATIN CALCIUM 20 MG PO TABS
40.0000 mg | ORAL_TABLET | Freq: Every day | ORAL | Status: DC
  Administered 2020-10-03: 40 mg via ORAL
  Filled 2020-10-03: qty 2

## 2020-10-03 NOTE — Plan of Care (Signed)
  Problem: Health Behavior/Discharge Planning: Goal: Ability to manage health-related needs will improve Outcome: Progressing   Problem: Pain Managment: Goal: General experience of comfort will improve Outcome: Progressing   

## 2020-10-03 NOTE — Discharge Summary (Signed)
Discharge Summary  Brad GaussJoseph Lowe BJY:[id number] DOB: 10/26/1976  PCP: Brad CloudLombardo, Brad L, MD  Admit date: 10/02/2020 Discharge date: 10/03/2020  Time spent: 30 mins  Recommendations for Outpatient Follow-up:  1. PCP in 1 week 2. Cardiology follow-up in 1 week  Discharge Diagnoses:  Active Hospital Problems   Diagnosis Date Noted  . Left chest pressure 10/02/2020  . Obesity, Class III, BMI 40-49.9 (morbid obesity) (HCC) 10/02/2020  . Elevated BP without diagnosis of hypertension 10/02/2020    Resolved Hospital Problems  No resolved problems to display.    Discharge Condition: Stable  Diet recommendation: Heart healthy  Vitals:   10/03/20 1254 10/03/20 1551  BP: (!) 141/87 130/77  Pulse: 61 67  Resp: 18   Temp: 98.2 F (36.8 C) 98.1 F (36.7 C)  SpO2: 99% 94%    History of present illness:  44 year old male with medical history significant of morbid obesity, GERD, back pain, status post spinal fusion presented with 5 days of intermittent palpitations, left neck pain and left arm numbness, denies any significant chest pain or chest pressure, diaphoresis, nausea/vomiting.  Was feeling some mild shortness of breath when experiencing significant palpitations.  Of note, patient had a normal cardiac cath in 2018, told symptoms could be due to Neurontin which he has stopped taking.  Reports mother has crest syndrome, CAD in mother's family in their late 6440s.  Due to worsening symptoms, patient presented to the ED.  Patient noted to be clinically symptomatic with multiple PVCs, troponin WNL, EKG with bigeminy and PVCs.  Admitted for further management.     Today, patient intermittent palpitations has improved since taking metoprolol, denies any chest heaviness, left-sided chest pain.  Patient denies any nausea/vomiting, worsening shortness of breath, abdominal pain, fever/chills.    Hospital Course:  Active Problems:   Left chest pressure   Obesity, Class III, BMI 40-49.9  (morbid obesity) (HCC)   Elevated BP without diagnosis of hypertension   ACS rule out Symptomatic PVCs Noted to have intermittent palpitations, left arm numbness Troponins WNL, EKG with with some nonspecific T wave changes in the anterior leads Echo pending, follow-up in 1 week with report Nuclear stress test done showed small defect of mild severity present in the apex location, EF mildly decreased 45 to 54%, cardiology stated nuclear stress test is negative for ischemia Cardiology consulted, stable to DC from their standpoint, increase metoprolol tartrate to 25 mg twice daily, review echo, Holter monitor as an outpatient, follow-up in 1 week Cardiology follow-up in 1 week  Hyperlipidemia LDL 126 Started Lipitor  Possible undiagnosed hypertension Continue recently started p.o. metoprolol  Elevated D-dimer CTA chest negative for any PE  GERD Continue PPI  Morbid obesity Lifestyle modification advised  OSA Continue CPAP        Malnutrition Type:      Malnutrition Characteristics:      Nutrition Interventions:      Estimated body mass index is 42.4 kg/m as calculated from the following:   Height as of this encounter: 6' (1.829 m).   Weight as of this encounter: 141.8 kg.    Procedures:  Nuclear stress test  Consultations:  Cardiology  Discharge Exam: BP 130/77 (BP Location: Right Arm)   Pulse 67   Temp 98.1 F (36.7 C) (Oral)   Resp 18   Ht 6' (1.829 m)   Wt (!) 141.8 kg   SpO2 94%   BMI 42.40 kg/m   General: NAD Cardiovascular: S1, S2 present Respiratory: CTA B  Discharge Instructions You were cared for by a hospitalist during your hospital stay. If you have any questions about your discharge medications or the care you received while you were in the hospital after you are discharged, you can call the unit and asked to speak with the hospitalist on call if the hospitalist that took care of you is not available. Once you are  discharged, your primary care physician will handle any further medical issues. Please note that NO REFILLS for any discharge medications will be authorized once you are discharged, as it is imperative that you return to your primary care physician (or establish a relationship with a primary care physician if you do not have one) for your aftercare needs so that they can reassess your need for medications and monitor your lab values.   Allergies as of 10/03/2020      Reactions   Gabapentin Other (See Comments)   Chest pain/numbness      Medication List    TAKE these medications   acetaminophen 325 MG tablet Commonly known as: TYLENOL Take 325-650 mg by mouth every 6 (six) hours as needed for mild pain or fever.   atorvastatin 40 MG tablet Commonly known as: LIPITOR Take 1 tablet (40 mg total) by mouth daily.   DULoxetine 60 MG capsule Commonly known as: CYMBALTA Take 60 mg by mouth daily.   meloxicam 15 MG tablet Commonly known as: MOBIC Take 7.5-15 mg by mouth daily.   metoprolol tartrate 25 MG tablet Commonly known as: LOPRESSOR Take 1 tablet (25 mg total) by mouth 2 (two) times daily.   omeprazole 20 MG capsule Commonly known as: PRILOSEC Take 20 mg by mouth daily.   oxyCODONE 5 MG immediate release tablet Commonly known as: Oxy IR/ROXICODONE Take 5 mg by mouth daily as needed for moderate pain or severe pain.   tadalafil 5 MG tablet Commonly known as: CIALIS Take 5 mg by mouth daily as needed for erectile dysfunction.   tiZANidine 4 MG tablet Commonly known as: ZANAFLEX Take 4 mg by mouth every 6 (six) hours as needed for muscle spasms.      Allergies  Allergen Reactions  . Gabapentin Other (See Comments)    Chest pain/numbness    Follow-up Information    Brad Cloud, MD. Schedule an appointment as soon as possible for a visit in 1 week(s).   Specialty: Internal Medicine Contact information: [address] [place] [address] [phone]          Brad Millard, MD. Schedule an appointment as soon as possible for a visit in 1 week(s).   Specialty: Cardiology Contact information: [address] [facility] [place] [address] [phone]                The results of significant diagnostics from this hospitalization (including imaging, microbiology, ancillary and laboratory) are listed below for reference.    Significant Diagnostic Studies: DG Chest 2 View  Result Date: 10/02/2020 CLINICAL DATA:  Chest pain and shortness of breath EXAM: CHEST - 2 VIEW COMPARISON:  None. FINDINGS: The heart size and mediastinal contours are within normal limits. Both lungs are clear. The visualized skeletal structures are unremarkable. IMPRESSION: No active cardiopulmonary disease. Electronically Signed   By: Gaylyn Rong M.D.   On: 10/02/2020 14:28   CT ANGIO CHEST PE W OR WO CONTRAST  Result Date: 10/03/2020 CLINICAL DATA:  Positive D-dimer, rule out PE EXAM: CT ANGIOGRAPHY CHEST WITH CONTRAST TECHNIQUE: Multidetector CT imaging of  the chest was performed using the standard protocol during bolus administration of intravenous contrast. Multiplanar CT image reconstructions and MIPs were obtained to evaluate the vascular anatomy. CONTRAST:  59mL OMNIPAQUE IOHEXOL 350 MG/ML SOLN COMPARISON:  None. FINDINGS: Cardiovascular: Satisfactory opacification of the pulmonary arteries to the segmental level. No evidence of pulmonary embolism. Mild cardiomegaly. No pericardial effusion. Mediastinum/Nodes: No enlarged mediastinal, hilar, or axillary lymph nodes. Thyroid gland, trachea, and esophagus demonstrate no significant findings. Lungs/Pleura: Lungs are clear. No pleural effusion or pneumothorax. Upper Abdomen: No acute abnormality.  Hepatic steatosis. Musculoskeletal: No chest wall abnormality. No acute or significant osseous findings. Review of the MIP images confirms the above findings. IMPRESSION: 1. Negative  examination for pulmonary embolism. 2. Mild cardiomegaly. 3. Hepatic steatosis. Electronically Signed   By: Lauralyn Primes M.D.   On: 10/03/2020 15:55   NM Myocar Multi W/Spect W/Wall Motion / EF  Result Date: 10/03/2020  Blood pressure demonstrated a normal response to exercise.  There was no ST segment deviation noted during stress.  Defect 1: There is a small defect of mild severity present in the apex location.  This is a low risk study.  The left ventricular ejection fraction is mildly decreased (45-54%).     Microbiology: Recent Results (from the past 240 hour(s))  Respiratory Panel by RT PCR (Flu A&B, Covid) - Nasopharyngeal Swab     Status: None   Collection Time: 10/02/20  6:23 PM   Specimen: Nasopharyngeal Swab  Result Value Ref Range Status   SARS Coronavirus 2 by RT PCR NEGATIVE NEGATIVE Final    Comment: (NOTE) SARS-CoV-2 target nucleic acids are NOT DETECTED.  The SARS-CoV-2 RNA is generally detectable in upper respiratoy specimens during the acute phase of infection. The lowest concentration of SARS-CoV-2 viral copies this assay can detect is 131 copies/mL. A negative result does not preclude SARS-Cov-2 infection and should not be used as the sole basis for treatment or other patient management decisions. A negative result may occur with  improper specimen collection/handling, submission of specimen other than nasopharyngeal swab, presence of viral mutation(s) within the areas targeted by this assay, and inadequate number of viral copies (<131 copies/mL). A negative result must be combined with clinical observations, patient history, and epidemiological information. The expected result is Negative.  Fact Sheet for Patients:  https://www.moore.com/  Fact Sheet for Healthcare Providers:  https://www.young.biz/  This test is no t yet approved or cleared by the [place] FDA and  has been authorized for detection and/or  diagnosis of SARS-CoV-2 by FDA under an Emergency Use Authorization (EUA). This EUA will remain  in effect (meaning this test can be used) for the duration of the COVID-19 declaration under Section 564(b)(1) of the Act, 21 U.S.C. section 360bbb-3(b)(1), unless the authorization is terminated or revoked sooner.     Influenza A by PCR NEGATIVE NEGATIVE Final   Influenza B by PCR NEGATIVE NEGATIVE Final    Comment: (NOTE) The Xpert Xpress SARS-CoV-2/FLU/RSV assay is intended as an aid in  the diagnosis of influenza from Nasopharyngeal swab specimens and  should not be used as a sole basis for treatment. Nasal washings and  aspirates are unacceptable for Xpert Xpress SARS-CoV-2/FLU/RSV  testing.  Fact Sheet for Patients: https://www.moore.com/  Fact Sheet for Healthcare Providers: https://www.young.biz/  This test is not yet approved or cleared by the [place] FDA and  has been authorized for detection and/or diagnosis of SARS-CoV-2 by  FDA under an Emergency Use Authorization (EUA). This EUA will remain  in effect (meaning this test can be used) for the duration of the  Covid-19 declaration under Section 564(b)(1) of the Act, 21  U.S.C. section 360bbb-3(b)(1), unless the authorization is  terminated or revoked. Performed at [facility], [address] [place], [place] [address]      Labs: Basic Metabolic Panel: Recent Labs  Lab 10/02/20 1415 10/03/20 0548  NA 138 137  K 4.3 3.8  CL 103 104  CO2 28 25  GLUCOSE 101* 95  BUN 12 13  CREATININE 1.08 0.90  CALCIUM 9.0 8.7*  MG 2.0 2.1  PHOS 3.0 3.2   Liver Function Tests: Recent Labs  Lab 10/02/20 1351 10/03/20 0548  AST 27 21  ALT 25 21  ALKPHOS 58 56  BILITOT 0.6 0.6  PROT 7.7 7.4  ALBUMIN 3.8 3.7   No results for input(s): LIPASE, AMYLASE in the last 168 hours. No results for input(s): AMMONIA in the last 168 hours. CBC: Recent Labs  Lab  10/02/20 1351 10/03/20 0548  WBC 8.8 5.4  NEUTROABS  --  2.8  HGB 14.7 15.0  HCT 43.5 44.9  MCV 86.0 86.5  PLT 250 239   Cardiac Enzymes: No results for input(s): CKTOTAL, CKMB, CKMBINDEX, TROPONINI in the last 168 hours. BNP: BNP (last 3 results) No results for input(s): BNP in the last 8760 hours.  ProBNP (last 3 results) No results for input(s): PROBNP in the last 8760 hours.  CBG: No results for input(s): GLUCAP in the last 168 hours.     Signed:  Briant Cedar, MD [facility] 10/03/2020, 5:30 PM

## 2020-10-03 NOTE — Progress Notes (Signed)
*  PRELIMINARY RESULTS* Echocardiogram 2D Echocardiogram has been performed.  Joanette Gula Aesha Agrawal 10/03/2020, 4:45 PM

## 2020-10-03 NOTE — Consult Note (Signed)
CARDIOLOGY CONSULT NOTE               Patient ID: Brad Lowe MRN: [id number] DOB/AGE: July 09, 1976 44 y.o.  Admit date: 10/02/2020 Referring Physician Sharolyn Douglas Primary Physician Dellia Cloud, MD  Primary Cardiologist none per patient Reason for Consultation chest pain  HPI: 44 year old gentleman referred for evaluation of chest pain. The patient has a history of obesity, GERD, OSA, CPAP compliant, chronic back pain, and previous unremarkable cardiac catheterization in 2018 at the [facility]  The patient presented to [facility] ER for palpitations, left sided neck pain, and left arm numbness.  The patient reports a several day history of palpitations with associated mild chest pressure with radiation to the base of the left side of his neck and left arm tingling, which lasts about a minute and reoccurs every few minutes, occurring nearly all day.  After several episodes of palpitations, the patient reports some associated shortness of breath.  He reports that he has been under more stress recently.  Upon arrival to the ER, he was mildly hypertensive with blood pressure 146/78, heart rate 79 bpm, normal respiration, SPO2 99% on room air. Admission labs notable for largely normal electrolytes, no evidence of anemia, normal TSH, negative urine tox, normal high sensitivity troponin (4 and 5), and D-dimer elevated to 1048. Chest CTA was negative for pulmonary embolism, with mild cardiomegaly, and hepatic steatosis. ECG revealed sinus rhythm at a rate of 75 bpm with T wave abnormalities anteriorly. Repeat ECGs were similar without dynamic ischemic changes.  The patient underwent Lexiscan Myoview today which was a low risk study, revealing mildly reduced LV function with LVEF 45-54% with mild apical scar, likely artifact, without clinical history of MI, without evidence of ischemia. Review of telemetry strips shows sinus rhythm with occasional PVCs. He was started on low dose metoprolol  tartrate 12.5 mg BID with no significant change in symptoms.  Of note, the patient reports very similar symptoms in 2018, though at that time he experienced facial paresthesias.  He underwent a heart catheterization at that time, which was reportedly insignificant.  His gabapentin was discontinued, and he has had no recurrent palpitations or paresthesias until this recent episode.   The patient denies a known history of MI, stroke, heart failure, diabetes, or CKD. He denies tobacco use, drinks alcohol occasionally, and drinks 2 cups of coffee in the morning, which is not new.  Review of systems complete and found to be negative unless listed above     Past Medical History:  Diagnosis Date   GERD (gastroesophageal reflux disease)    History of spinal fusion     Past Surgical History:  Procedure Laterality Date   BACK SURGERY     SPINAL FUSION     TONSILLECTOMY      Medications Prior to Admission  Medication Sig Dispense Refill Last Dose   acetaminophen (TYLENOL) 325 MG tablet Take 325-650 mg by mouth every 6 (six) hours as needed for mild pain or fever.   Unknown at PRN   DULoxetine (CYMBALTA) 60 MG capsule Take 60 mg by mouth daily.      meloxicam (MOBIC) 15 MG tablet Take 7.5-15 mg by mouth daily.       omeprazole (PRILOSEC) 20 MG capsule Take 20 mg by mouth daily.      oxyCODONE (OXY IR/ROXICODONE) 5 MG immediate release tablet Take 5 mg by mouth daily as needed for moderate pain or severe pain.   Unknown at PRN   tadalafil (  CIALIS) 5 MG tablet Take 5 mg by mouth daily as needed for erectile dysfunction.      tiZANidine (ZANAFLEX) 4 MG tablet Take 4 mg by mouth every 6 (six) hours as needed for muscle spasms.   Unknown at PRN   Social History   Socioeconomic History   Marital status: Married    Spouse name: Not on file   Number of children: Not on file   Years of education: Not on file   Highest education level: Not on file  Occupational History   Not on  file  Tobacco Use   Smoking status: Never Smoker   Smokeless tobacco: Never Used  Vaping Use   Vaping Use: Never used  Substance and Sexual Activity   Alcohol use: Yes   Drug use: Never   Sexual activity: Not on file  Other Topics Concern   Not on file  Social History Narrative   Not on file   Social Determinants of Health   Financial Resource Strain:    Difficulty of Paying Living Expenses: Not on file  Food Insecurity:    Worried About Running Out of Food in the Last Year: Not on file   Ran Out of Food in the Last Year: Not on file  Transportation Needs:    Lack of Transportation (Medical): Not on file   Lack of Transportation (Non-Medical): Not on file  Physical Activity:    Days of Exercise per Week: Not on file   Minutes of Exercise per Session: Not on file  Stress:    Feeling of Stress : Not on file  Social Connections:    Frequency of Communication with Friends and Family: Not on file   Frequency of Social Gatherings with Friends and Family: Not on file   Attends Religious Services: Not on file   Active Member of Clubs or Organizations: Not on file   Attends Banker Meetings: Not on file   Marital Status: Not on file  Intimate Partner Violence:    Fear of Current or Ex-Partner: Not on file   Emotionally Abused: Not on file   Physically Abused: Not on file   Sexually Abused: Not on file    Family History  Problem Relation Age of Onset   Hypertension Mother    Stroke Mother    Healthy Father       Review of systems complete and found to be negative unless listed above      PHYSICAL EXAM  General: Well developed, well nourished, in no acute distress HEENT:  Normocephalic and atramatic Neck:  No JVD.  Lungs: Clear bilaterally to auscultation, normal effort of breathing on room air. Heart: HRRR . Normal S1 and S2 without gallops or murmurs.  Abdomen: no obvious distention Msk:  Back normal,  Gait not assessed.  Normal strength and tone for age. Extremities: No clubbing, cyanosis or edema.   Neuro: Alert and oriented X 3. Psych:  Good affect, responds appropriately  Labs:   Lab Results  Component Value Date   WBC 5.4 10/03/2020   HGB 15.0 10/03/2020   HCT 44.9 10/03/2020   MCV 86.5 10/03/2020   PLT 239 10/03/2020    Recent Labs  Lab 10/03/20 0548  NA 137  K 3.8  CL 104  CO2 25  BUN 13  CREATININE 0.90  CALCIUM 8.7*  PROT 7.4  BILITOT 0.6  ALKPHOS 56  ALT 21  AST 21  GLUCOSE 95   No results found for: CKTOTAL, CKMB, CKMBINDEX,  TROPONINI  Lab Results  Component Value Date   CHOL 193 10/03/2020   Lab Results  Component Value Date   HDL 37 (L) 10/03/2020   Lab Results  Component Value Date   LDLCALC 126 (H) 10/03/2020   Lab Results  Component Value Date   TRIG 152 (H) 10/03/2020   Lab Results  Component Value Date   CHOLHDL 5.2 10/03/2020   No results found for: LDLDIRECT    Radiology: DG Chest 2 View  Result Date: 10/02/2020 CLINICAL DATA:  Chest pain and shortness of breath EXAM: CHEST - 2 VIEW COMPARISON:  None. FINDINGS: The heart size and mediastinal contours are within normal limits. Both lungs are clear. The visualized skeletal structures are unremarkable. IMPRESSION: No active cardiopulmonary disease. Electronically Signed   By: Gaylyn Rong M.D.   On: 10/02/2020 14:28   NM Myocar Multi W/Spect W/Wall Motion / EF  Result Date: 10/03/2020  Blood pressure demonstrated a normal response to exercise.  There was no ST segment deviation noted during stress.  Defect 1: There is a small defect of mild severity present in the apex location.  This is a low risk study.  The left ventricular ejection fraction is mildly decreased (45-54%).     EKG: sinus rhythm  ASSESSMENT AND PLAN:  1. Palpitations, likely symptomatic PVCs; telemetry monitoring shows sinus rhythm with occasional PVCs. Electrolytes are within normal range, nuclear stress test is negative  for ischemia. High sensitivity troponin normal x 2. Chest CTA negative for PE. Patient had previous episode in 2018 at which time cardiac catheterization which reportedly unremarkable.  2. Hypertension 3. Obesity 4. OSA, CPAP compliant  Recommendations: 1. Increase metoprolol tartrate to 25 mg BID 2. Review 2D echocardiogram 3. Plan to order Holter monitor as outpatient 4. Follow-up with Dr. Darrold Junker in 1 week  Sign off for now; please call or [facility] with any questions.   Signed: Leanora Ivanoff PA-C 10/03/2020, 2:01 PM   Discussed with Dr. Darrold Junker who agrees with the above plan.

## 2020-10-03 NOTE — Progress Notes (Signed)
Patient alert and oriented, vss, no complaints of pain.  D/c telemetry and piv. Patient to be escorted out of hosptial via wheelchair by nursing staff.

## 2020-10-03 NOTE — Progress Notes (Signed)
Pt was admitted on the floor with no signs of distress. Pt alert x 4. VSS. Pt was educated about safety and ascom with pt reach. Will continue to monitor.

## 2020-10-03 NOTE — Progress Notes (Signed)
PHARMACIST - PHYSICIAN COMMUNICATION  CONCERNING:  Enoxaparin (Lovenox) for DVT Prophylaxis    RECOMMENDATION: Patient was prescribed enoxaprin 40mg  q24 hours for VTE prophylaxis.   Filed Weights   10/02/20 1343 10/02/20 2210 10/03/20 0439  Weight: (!) 142.9 kg (315 lb) (!) 141.8 kg (312 lb 9.6 oz) (!) 141.8 kg (312 lb 9.8 oz)    Body mass index is 42.4 kg/m.  Estimated Creatinine Clearance: 127.5 mL/min (by C-G formula based on SCr of 1.08 mg/dL).   Based on [facility] policy patient is candidate for enoxaparin 0.5mg /kg TBW SQ every 24 hours based on BMI being >30.  DESCRIPTION: Pharmacy has adjusted enoxaparin dose per [facility] policy.  Patient is now receiving enoxaparin 70 mg every 24 hours    [facility], PharmD Clinical Pharmacist  10/03/2020 7:16 AM

## 2020-10-04 LAB — ECHOCARDIOGRAM COMPLETE
AR max vel: 3.01 cm2
AV Area VTI: 3.27 cm2
AV Area mean vel: 2.7 cm2
AV Mean grad: 5 mmHg
AV Peak grad: 7.6 mmHg
Ao pk vel: 1.38 m/s
Area-P 1/2: 2.91 cm2
Height: 72 in
S' Lateral: 4.17 cm
Weight: 5001.8 oz
